# Patient Record
Sex: Male | Born: 1990 | Hispanic: Yes | Marital: Single | Smoking: Former smoker
Health system: Southern US, Community
[De-identification: ages and names within clinical notes are randomized; demographics above are authoritative.]

## PROBLEM LIST (undated history)

## (undated) DIAGNOSIS — M7918 Myalgia, other site: Secondary | ICD-10-CM

## (undated) DIAGNOSIS — K219 Gastro-esophageal reflux disease without esophagitis: Secondary | ICD-10-CM

## (undated) DIAGNOSIS — A09 Infectious gastroenteritis and colitis, unspecified: Secondary | ICD-10-CM

## (undated) DIAGNOSIS — E669 Obesity, unspecified: Secondary | ICD-10-CM

## (undated) DIAGNOSIS — S73191A Other sprain of right hip, initial encounter: Secondary | ICD-10-CM

## (undated) DIAGNOSIS — E66811 Obesity, class 1: Secondary | ICD-10-CM

## (undated) DIAGNOSIS — Z8619 Personal history of other infectious and parasitic diseases: Secondary | ICD-10-CM

## (undated) DIAGNOSIS — M546 Pain in thoracic spine: Secondary | ICD-10-CM

## (undated) DIAGNOSIS — J309 Allergic rhinitis, unspecified: Secondary | ICD-10-CM

## (undated) DIAGNOSIS — M25859 Other specified joint disorders, unspecified hip: Secondary | ICD-10-CM

## (undated) DIAGNOSIS — G8929 Other chronic pain: Secondary | ICD-10-CM

## (undated) HISTORY — PX: FEMUR SURGERY: SHX943

## (undated) HISTORY — DX: Pain in thoracic spine: M54.6

## (undated) HISTORY — DX: Personal history of other infectious and parasitic diseases: Z86.19

## (undated) HISTORY — DX: Gastro-esophageal reflux disease without esophagitis: K21.9

## (undated) HISTORY — DX: Allergic rhinitis, unspecified: J30.9

## (undated) HISTORY — DX: Infectious gastroenteritis and colitis, unspecified: A09

## (undated) HISTORY — DX: Myalgia, other site: M79.18

## (undated) HISTORY — DX: Other chronic pain: G89.29

## (undated) HISTORY — DX: Other specified joint disorders, unspecified hip: M25.859

## (undated) HISTORY — DX: Obesity, class 1: E66.811

## (undated) HISTORY — DX: Other sprain of right hip, initial encounter: S73.191A

## (undated) HISTORY — DX: Obesity, unspecified: E66.9

---
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
## 2016-06-07 DIAGNOSIS — A09 Infectious gastroenteritis and colitis, unspecified: Secondary | ICD-10-CM

## 2016-06-07 DIAGNOSIS — M25859 Other specified joint disorders, unspecified hip: Secondary | ICD-10-CM

## 2016-06-07 DIAGNOSIS — S73191A Other sprain of right hip, initial encounter: Secondary | ICD-10-CM

## 2016-06-07 HISTORY — DX: Other specified joint disorders, unspecified hip: M25.859

## 2016-06-07 HISTORY — DX: Infectious gastroenteritis and colitis, unspecified: A09

## 2016-06-07 HISTORY — DX: Other sprain of right hip, initial encounter: S73.191A

## 2017-04-04 ENCOUNTER — Encounter: Payer: Self-pay | Admitting: Family Medicine

## 2017-04-04 HISTORY — PX: COLONOSCOPY: SHX174

## 2017-04-04 HISTORY — PX: UPPER GASTROINTESTINAL ENDOSCOPY: SHX188

## 2017-04-04 LAB — HM COLONOSCOPY

## 2017-04-12 DIAGNOSIS — N50819 Testicular pain, unspecified: Secondary | ICD-10-CM | POA: Insufficient documentation

## 2017-05-26 ENCOUNTER — Ambulatory Visit
Admission: RE | Admit: 2017-05-26 | Discharge: 2017-05-26 | Disposition: A | Discharge status: Home / Self Care | Payer: PRIVATE HEALTH INSURANCE | Source: Ambulatory Visit | Attending: Pain Medicine | Admitting: Pain Medicine

## 2017-05-26 ENCOUNTER — Ambulatory Visit: Payer: PRIVATE HEALTH INSURANCE | Attending: Pain Medicine | Admitting: Pain Medicine

## 2017-05-26 ENCOUNTER — Encounter: Payer: Self-pay | Admitting: Pain Medicine

## 2017-05-26 ENCOUNTER — Other Ambulatory Visit: Payer: Self-pay

## 2017-05-26 VITALS — BP 127/85 | HR 76 | Temp 98.4°F | Resp 16 | Ht 70.0 in | Wt 189.0 lb

## 2017-05-26 DIAGNOSIS — K219 Gastro-esophageal reflux disease without esophagitis: Secondary | ICD-10-CM | POA: Insufficient documentation

## 2017-05-26 DIAGNOSIS — M545 Low back pain: Secondary | ICD-10-CM | POA: Insufficient documentation

## 2017-05-26 DIAGNOSIS — M549 Dorsalgia, unspecified: Secondary | ICD-10-CM | POA: Diagnosis not present

## 2017-05-26 DIAGNOSIS — N433 Hydrocele, unspecified: Secondary | ICD-10-CM | POA: Insufficient documentation

## 2017-05-26 DIAGNOSIS — R1013 Epigastric pain: Secondary | ICD-10-CM

## 2017-05-26 DIAGNOSIS — M546 Pain in thoracic spine: Secondary | ICD-10-CM | POA: Insufficient documentation

## 2017-05-26 DIAGNOSIS — M25551 Pain in right hip: Secondary | ICD-10-CM | POA: Insufficient documentation

## 2017-05-26 DIAGNOSIS — G8929 Other chronic pain: Secondary | ICD-10-CM | POA: Insufficient documentation

## 2017-05-26 DIAGNOSIS — Z87891 Personal history of nicotine dependence: Secondary | ICD-10-CM | POA: Insufficient documentation

## 2017-05-26 DIAGNOSIS — Z789 Other specified health status: Secondary | ICD-10-CM

## 2017-05-26 DIAGNOSIS — M899 Disorder of bone, unspecified: Secondary | ICD-10-CM

## 2017-05-26 DIAGNOSIS — Z79899 Other long term (current) drug therapy: Secondary | ICD-10-CM | POA: Insufficient documentation

## 2017-05-26 DIAGNOSIS — M7918 Myalgia, other site: Secondary | ICD-10-CM | POA: Insufficient documentation

## 2017-05-26 DIAGNOSIS — M5441 Lumbago with sciatica, right side: Secondary | ICD-10-CM

## 2017-05-26 MED ORDER — PREDNISONE 20 MG PO TABS: ORAL_TABLET | ORAL | 0 refills | Status: DC

## 2017-05-26 MED ORDER — PREDNISONE 20 MG PO TABS: ORAL_TABLET | ORAL | 0 refills | Status: AC

## 2017-05-26 MED ORDER — TRIAMCINOLONE ACETONIDE 40 MG/ML IJ SUSP
40.0000 mg | Freq: Once | INTRAMUSCULAR | Status: AC
  Administered 2017-05-26: 40 mg
  Filled 2017-05-26: qty 1

## 2017-05-26 MED ORDER — CYCLOBENZAPRINE HCL 5 MG PO TABS: 5.0000 mg | ORAL_TABLET | Freq: Three times a day (TID) | ORAL | 0 refills | Status: DC | PRN

## 2017-05-26 MED ORDER — ROPIVACAINE HCL 2 MG/ML IJ SOLN
4.0000 mL | Freq: Once | INTRAMUSCULAR | Status: AC
  Administered 2017-05-26: 10 mL
  Filled 2017-05-26: qty 10

## 2017-05-26 MED ORDER — LIDOCAINE HCL 2 % IJ SOLN
20.0000 mL | Freq: Once | INTRAMUSCULAR | Status: AC
  Administered 2017-05-26: 800 mg

## 2017-05-26 NOTE — Progress Notes (Signed)
Patient's Name: Nicholas Sullivan  MRN: [id number]  Referring Provider: No ref. provider found  DOB: February 08, 1991  PCP: Patient, No Pcp Per  DOS: 05/26/2017  Note by: Nicholas Done, MD  Service setting: Ambulatory outpatient  Specialty: Interventional Pain Management  Location: [facility] (AMB) Pain Management Facility    Patient type: New patient ("FAST-TRACK" Evaluation) &  Interventional Procedure   Warning: This referral option does not include the extensive pharmacological evaluation required for [place] to take over the patient's medication management. The "Fast-Track" system is designed to bypass the new patient referral waiting list, as well as the normal patient evaluation process, in order to provide a patient in distress with a timely pain management intervention. Because the system was not designed to unfairly get a patient into our pain practice ahead of those already waiting, certain restrictions apply. By requesting a "Fast-Track" consult, the referring physician has opted to continue managing the patient's medications in order to get interventional urgent care.  Primary Reason for Visit: Interventional Pain Management Treatment. CC: Back Pain  Procedure:  Anesthesia, Analgesia, Anxiolysis:  Type: Trigger Point Injection (1-2 muscle groups) CPT: 20552 Region:Posterior Thoracolumbar Level: Thoracolumbar Laterality: Right-Sided Paraspinal  Type: Local Anesthesia Local Anesthetic: Lidocaine 1% Route: Infiltration ([place]/IM) IV Access: Declined Sedation: Declined  Indication(s): Analgesia           Indications: 1. Trigger point with back pain   2. Myofascial pain syndrome   3. Upper back pain on right side   4. Chronic thoracic spine pain   5. Chronic low back pain   6. Chronic hip pain, right   7. Disorder of skeletal system   8. Pharmacologic therapy   9. Problems influencing health status    HPI  [name] is a 26 y.o. year old, male patient, who comes today  for a  "Fast-Track" new patient evaluation, as requested by No ref. provider found. The patient has been made aware that this type of referral option is reserved for the Interventional Pain Management portion of our practice and completely excludes the option of medication management. His primarily concern today is the Back Pain  Pain Assessment: Location: Mid, Lower Back Radiating: The pain goes to the front of the ribs. Onset: More than a month ago Duration: Chronic pain Quality: Aching, Radiating, Pressure, Burning, Tender Severity: 3 /10 (self-reported pain score)  Note: Reported level is compatible with observation. A 3/10 is viewed as "Moderate" and described as significantly interfering with activities of daily living (ADL). It becomes difficult to feed, bathe, get dressed, get on and off the toilet or to perform personal hygiene functions. Difficult to get in and out of bed or a chair without assistance. Very distracting. With effort, it can be ignored when deeply involved in activities. When using our objective Pain Scale, levels between 6 and 10/10 are said to belong in an emergency room, as it progressively worsens from a 6/10, described as severely limiting, requiring emergency care not usually available at an outpatient pain management facility. At a 6/10 level, communication becomes difficult and requires great effort. Assistance to reach the emergency department may be required. Facial flushing and profuse sweating along with potentially dangerous increases in heart rate and blood pressure will be evident. Timing: Constant Modifying factors: rest and hot packs.  Onset and Duration: Sudden and Date of onset: 05/09/2017 Cause of pain: Unknown Severity: Getting better, NAS-11 at its worse: 8/10, NAS-11 at its best: 4/10, NAS-11 now: 4/10 and NAS-11 on the  average: 6/10 Timing: Not influenced by the time of the day, During activity or exercise and After activity or exercise Aggravating  Factors: Bending, Lifiting, Motion, Prolonged standing, Twisting and Walking Alleviating Factors: Hot packs, Lying down, Medications, Resting and Sleeping Associated Problems: Day-time cramps, Night-time cramps, Numbness, Tingling and Pain that does not allow patient to sleep Quality of Pain: Aching, Agonizing, Intermittent, Pulsating, Sharp, Stabbing and Throbbing Previous Examinations or Tests: CT scan, MRI scan and Orthopedic evaluation Previous Treatments: Trigger point injections  The patient comes into the clinics today, referred to [place] for a chronic pain management and evaluation. According to the patient for the past 4 months he has been having some problems with abdominal pain, low back pain, and more recently some hip pain and upper back pain. During today's evaluation, he indicates that everything has been improving with the exception of the upper back pain. Physical examination of the area reveals an active trigger point to the right of the paravertebral muscles at the T7-8 level.   Meds   Current Outpatient Medications:  .  acetaminophen (TYLENOL) 325 MG tablet, Take by mouth daily., Disp: , Rfl:  .  pantoprazole (PROTONIX) 40 MG tablet, Take 80 mg by mouth., Disp: , Rfl:  .  predniSONE (DELTASONE) 20 MG tablet, Take 3 tab(s) in the morning x 3 days, then 2 tab(s) x 3 days, followed by 1 tab x 3 days., Disp: 21 tablet, Rfl: 0 .  cyclobenzaprine (FLEXERIL) 5 MG tablet, Take 1 tablet (5 mg total) by mouth 3 (three) times daily as needed for muscle spasms., Disp: 90 tablet, Rfl: 0  Imaging Review  Large Joint Arthrocentesis: R greater trochanteric bursa Location: hip - R greater trochanteric bursa Medications administered: 0.5 mL Triamcinolone 40 MG/ML; 2 mL lidocaine 2 % Nicholas Sullivan 05/09/2017 10:17 AM  MRI OF THE RIGHT HIP WITH CONTRAST. IMPRESSION: 1. 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 2.  Contour irregularity of the anterior superior labrum compatible with fraying/tearing. 3. Mild peritendinous/bursal fluid at the trochanteric gluteus medius and minimus tendon attachments, right greater than left. 4. Tendinosis with trace peritendinous fluid at the ischial hamstring tendon attachments with possible few small foci of low-grade interstitial tearing. Dictated and electronically signed by Sander Radon, MD on 04/21/2017 11:08 AM  Complexity Note: Imaging results reviewed.                        ROS  Cardiovascular History: Chest pain Pulmonary or Respiratory History: Shortness of breath and Smoking Neurological History: No reported neurological signs or symptoms such as seizures, abnormal skin sensations, urinary and/or fecal incontinence, being born with an abnormal open spine and/or a tethered spinal cord Review of Past Neurological Studies: No results found for this or any previous visit. Psychological-Psychiatric History: Prone to panicking Gastrointestinal History: Reflux or heatburn and Irregular, infrequent bowel movements (Constipation) Genitourinary History: No reported renal or genitourinary signs or symptoms such as difficulty voiding or producing urine, peeing blood, non-functioning kidney, kidney stones, difficulty emptying the bladder, difficulty controlling the flow of urine, or chronic kidney disease Hematological History: No reported hematological signs or symptoms such as prolonged bleeding, low or poor functioning platelets, bruising or bleeding easily, hereditary bleeding problems, low energy levels due to low hemoglobin or being anemic Endocrine History: No reported endocrine signs or symptoms such as high or low blood sugar, rapid heart rate due to high thyroid levels,  obesity or weight gain due to slow thyroid or thyroid disease Rheumatologic History: No reported rheumatological signs and symptoms such as fatigue, joint pain, tenderness, swelling, redness,  heat, stiffness, decreased range of motion, with or without associated rash Musculoskeletal History: Negative for myasthenia gravis, muscular dystrophy, multiple sclerosis or malignant hyperthermia Work History: Working full time  Allergies  [name] is allergic to broccoli [brassica oleracea italica] and grass extracts [gramineae pollens].  Laboratory Chemistry   Note: [facility]  [facility]  Drug: Mr. Nicholas Nicholas Sullivan  reports that he does not use drugs. Alcohol:  reports that he does not drink alcohol. Tobacco:  reports that he quit smoking about 2 years ago. he has never used smokeless tobacco. Medical:  has a past medical history of GERD (gastroesophageal reflux disease). Family: family history includes Alcohol abuse in his father.  History reviewed. No pertinent surgical history. Active Ambulatory Problems    Diagnosis Date Noted  . Disorder of skeletal system 05/26/2017  . Pharmacologic therapy 05/26/2017  . Problems influencing health status 05/26/2017  . Chronic hip pain (Right) 05/26/2017  . Chronic low back pain 05/26/2017  . Chronic thoracic spine pain 05/26/2017  . Chronic upper back pain (Right) 05/26/2017  . Myofascial pain syndrome 05/26/2017  . Trigger point with back pain 05/26/2017  . Chronic musculoskeletal pain 05/26/2017  . Testicular pain 04/12/2017  . 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 Chronic epigastric abdominal pain 05/26/2017   Resolved Ambulatory Problems    Diagnosis Date Noted  . No Resolved Ambulatory Problems   Past Medical History:  Diagnosis Date  . GERD (gastroesophageal reflux disease)    Constitutional Exam  General appearance: Well nourished, well developed, and well hydrated. In no apparent acute distress Vitals:   05/26/17 1426 05/26/17 1554 05/26/17 1558  BP: 137/74 (!) 152/94 127/85  Pulse: 77 70 76  Resp: 16 16 16   Temp: 98.4 F (36.9 C)    TempSrc: Oral    SpO2: (!)  66% 96% 96%  Weight: 189 lb (85.7 kg)    Height: 5\' 10"  (1.778 m)     BMI Assessment: Estimated body mass index is 27.12 kg/m as calculated from the following:   Height as of this encounter: 5\' 10"  (1.778 m).   Weight as of this encounter: 189 lb (85.7 kg).  BMI interpretation table: BMI level Category Range association with higher incidence of chronic pain  <18 kg/m2 Underweight   18.5-24.9 kg/m2 Ideal body weight   25-29.9 kg/m2 Overweight Increased incidence by 20%  30-34.9 kg/m2 Obese (Class I) Increased incidence by 68%  35-39.9 kg/m2 Severe obesity (Class II) Increased incidence by 136%  >40 kg/m2 Extreme obesity (Class III) Increased incidence by 254%   BMI Readings from Last 4 Encounters:  05/26/17 27.12 kg/m   Wt Readings from Last 4 Encounters:  05/26/17 189 lb (85.7 kg)  Psych/Mental status: Alert, oriented x 3 (person, place, & time)       Eyes: PERLA Respiratory: No evidence of acute respiratory distress  Thoracic Spine Area Exam  Skin & Axial Inspection: No masses, redness, or swelling Alignment: Symmetrical Functional ROM: Unrestricted ROM Stability: No instability detected Muscle Tone/Strength: Functionally intact. No obvious neuro-muscular anomalies detected. Sensory (Neurological): Movement associated discomfort Muscle strength & Tone: Complains of area being tender to palpation  Lumbar Spine Area Exam  Skin & Axial Inspection: No masses, redness, or swelling Alignment: Symmetrical Functional ROM: Unrestricted ROM  Stability: No instability detected Muscle Tone/Strength: Functionally intact. No obvious neuro-muscular anomalies detected. Sensory (Neurological): Unimpaired Palpation: No palpable anomalies       Provocative Tests: Lumbar Hyperextension and rotation test: evaluation deferred today       Lumbar Lateral bending test: evaluation deferred today       Patrick's Maneuver: evaluation deferred today                    Gait & Posture  Assessment  Ambulation: Unassisted Gait: Relatively normal for age and body habitus Posture: WNL   Lower Extremity Exam    Side: Right lower extremity  Side: Left lower extremity  Skin & Extremity Inspection: Skin color, temperature, and hair growth are WNL. No peripheral edema or cyanosis. No masses, redness, swelling, asymmetry, or associated skin lesions. No contractures.  Skin & Extremity Inspection: Skin color, temperature, and hair growth are WNL. No peripheral edema or cyanosis. No masses, redness, swelling, asymmetry, or associated skin lesions. No contractures.  Functional ROM: Unrestricted ROM          Functional ROM: Unrestricted ROM          Muscle Tone/Strength: Functionally intact. No obvious neuro-muscular anomalies detected.  Muscle Tone/Strength: Functionally intact. No obvious neuro-muscular anomalies detected.  Sensory (Neurological): Unimpaired  Sensory (Neurological): Unimpaired  Palpation: No palpable anomalies  Palpation: No palpable anomalies   Pre-op Assessment:  [name] is a 26 y.o. (year old), male patient, seen today for interventional treatment. He  has no past surgical history on file. [name] has a current medication list which includes the following prescription(s): acetaminophen, pantoprazole, prednisone, and cyclobenzaprine. His primarily concern today is the Back Pain  Initial Vital Signs: Blood pressure 127/85, pulse 76, temperature 98.4 F (36.9 C), temperature source Oral, resp. rate 16, height 5\' 10"  (1.778 m), weight 189 lb (85.7 kg), SpO2 96 %. BMI: Estimated body mass index is 27.12 kg/m as calculated from the following:   Height as of this encounter: 5\' 10"  (1.778 m).   Weight as of this encounter: 189 lb (85.7 kg).  Risk Assessment: Allergies: Reviewed. He is allergic to broccoli [brassica oleracea italica] and grass extracts [gramineae pollens].  Allergy Precautions: None required Coagulopathies: Reviewed. None identified.   Blood-thinner therapy: None at this time Active Infection(s): Reviewed. None identified. [name] is afebrile  Site Confirmation: [name] was asked to confirm the procedure and laterality before marking the site Procedure checklist: Completed Consent: Before the procedure and under the influence of no sedative(s), amnesic(s), or anxiolytics, the patient was informed of the treatment options, risks and possible complications. To fulfill our ethical and legal obligations, as recommended by the American Medical Association's Code of Ethics, I have informed the patient of my clinical impression; the nature and purpose of the treatment or procedure; the risks, benefits, and possible complications of the intervention; the alternatives, including doing nothing; the risk(s) and benefit(s) of the alternative treatment(s) or procedure(s); and the risk(s) and benefit(s) of doing nothing. The patient was provided information about the general risks and possible complications associated with the procedure. These may include, but are not limited to: failure to achieve desired goals, infection, bleeding, organ or nerve damage, allergic reactions, paralysis, and death. In addition, the patient was informed of those risks and complications associated to the procedure, such as failure to decrease pain; infection; bleeding; organ or nerve damage with subsequent damage to sensory, motor, and/or autonomic systems, resulting in permanent pain, numbness,  and/or weakness of one or several areas of the body; allergic reactions; (i.e.: anaphylactic reaction); and/or death. Furthermore, the patient was informed of those risks and complications associated with the medications. These include, but are not limited to: allergic reactions (i.e.: anaphylactic or anaphylactoid reaction(s)); adrenal axis suppression; blood sugar elevation that in diabetics may result in ketoacidosis or comma; water retention that in patients  with history of congestive heart failure may result in shortness of breath, pulmonary edema, and decompensation with resultant heart failure; weight gain; swelling or edema; medication-induced neural toxicity; particulate matter embolism and blood vessel occlusion with resultant organ, and/or nervous system infarction; and/or aseptic necrosis of one or more joints. Finally, the patient was informed that Medicine is not an exact science; therefore, there is also the possibility of unforeseen or unpredictable risks and/or possible complications that may result in a catastrophic outcome. The patient indicated having understood very clearly. We have given the patient no guarantees and we have made no promises. Enough time was given to the patient to ask questions, all of which were answered to the patient's satisfaction. [name] has indicated that he wanted to continue with the procedure. Attestation: I, the ordering provider, attest that I have discussed with the patient the benefits, risks, side-effects, alternatives, likelihood of achieving goals, and potential problems during recovery for the procedure that I have provided informed consent. Date: 05/26/2017; Time: 6:42 PM  Pre-Procedure Preparation:  Monitoring: As per clinic protocol. Respiration, ETCO2, SpO2, BP, heart rate and rhythm monitor placed and checked for adequate function Safety Precautions: Patient was assessed for positional comfort and pressure points before starting the procedure. Time-out: I initiated and conducted the "Time-out" before starting the procedure, as per protocol. The patient was asked to participate by confirming the accuracy of the "Time Out" information. Verification of the correct person, site, and procedure were performed and confirmed by me, the nursing staff, and the patient. "Time-out" conducted as per Joint Commission's Universal Protocol (UP.01.01.01). "Time-out" Date & Time: 05/26/2017; 1555  hrs.  Description of Procedure Process:   Position: Prone Target Area: Trigger Point over the right erector spinae and a muscle, specifically the longissimus thoracis. Approach: Ipsilateral approach. Area Prepped: Entire Posterior Thoracolumbar Region Prepping solution: ChloraPrep (2% chlorhexidine gluconate and 70% isopropyl alcohol) Safety Precautions: Aspiration looking for blood return was conducted prior to all injections. At no point did we inject any substances, as a needle was being advanced. No attempts were made at seeking any paresthesias. Safe injection practices and needle disposal techniques used. Medications properly checked for expiration dates. SDV (single dose vial) medications used. Description of the Procedure: Protocol guidelines were followed. The patient was placed in position over the fluoroscopy table. The target area was identified and the area prepped in the usual manner. Skin desensitized using vapocoolant spray. Skin & deeper tissues infiltrated with local anesthetic. Appropriate amount of time allowed to pass for local anesthetics to take effect. The procedure needles were then advanced to the target area. Proper needle placement secured. Negative aspiration confirmed. Solution injected in intermittent fashion, asking for systemic symptoms every 0.5cc of injectate. The needles were then removed and the area cleansed, making sure to leave some of the prepping solution back to take advantage of its long term bactericidal properties. Vitals:   05/26/17 1426 05/26/17 1554 05/26/17 1558  BP: 137/74 (!) 152/94 127/85  Pulse: 77 70 76  Resp: 16 16 16   Temp: 98.4 F (36.9 C)    TempSrc: Oral  SpO2: (!) 66% 96% 96%  Weight: 189 lb (85.7 kg)    Height: 5\' 10"  (1.778 m)      Start Time: 1555 hrs. Materials:  Needle(s) Type: regular needle Gauge: 25G Length: 1.5-in Medication(s): We administered lidocaine, triamcinolone acetonide 40 mg/ml (1mL), and ropivacaine (PF) 2  mg/mL (0.2%) (4 mL). Please see chart orders for dosing details.  Imaging Guidance:  Type of Imaging Technique: None used Indication(s): N/A Exposure Time: No patient exposure Contrast: None used. Fluoroscopic Guidance: N/A Ultrasound Guidance: N/A Interpretation: N/A  Antibiotic Prophylaxis:  Indication(s): None identified Antibiotic given: None  Post-operative Assessment:  EBL: None Complications: No immediate post-treatment complications observed by team, or reported by patient. Note: The patient tolerated the entire procedure well. A repeat set of vitals were taken after the procedure and the patient was kept under observation following institutional policy, for this type of procedure. Post-procedural neurological assessment was performed, showing return to baseline, prior to discharge. The patient was provided with post-procedure discharge instructions, including a section on how to identify potential problems. Should any problems arise concerning this procedure, the patient was given instructions to immediately contact us, at any time, without hesitation. In any case, we plan to contact the patient by telephone for a follow-up status report regarding this interventional procedure. Comments:  No additional relevant information.  Plan of Care   Imaging Orders     DG Lumbar Spine Complete W/Bend     DG Thoracic Spine 2 View  Procedure Orders     TRIGGER POINT INJECTION  Medications ordered for procedure: Meds ordered this encounter  Medications  . lidocaine (XYLOCAINE) 2 % (with pres) injection 400 mg  . triamcinolone acetonide (KENALOG-40) injection 40 mg  . ropivacaine (PF) 2 mg/mL (0.2%) (NAROPIN) injection 4 mL  . predniSONE (DELTASONE) 20 MG tablet    Sig: Take 3 tab(s) in the morning x 3 days, then 2 tab(s) x 3 days, followed by 1 tab x 3 days.    Dispense:  21 tablet    Refill:  0    Do not add to the "Automatic Refill" notification system.  . cyclobenzaprine  (FLEXERIL) 5 MG tablet    Sig: Take 1 tablet (5 mg total) by mouth 3 (three) times daily as needed for muscle spasms.    Dispense:  90 tablet    Refill:  0    Do not place this medication, or any other prescription from our practice, on "Automatic Refill". Patient may have prescription filled one day early if pharmacy is closed on scheduled refill date.   Medications administered: We administered lidocaine, triamcinolone acetonide, and ropivacaine (PF) 2 mg/mL (0.2%).  See the medical record for exact dosing, route, and time of administration.  This SmartLink is deprecated. Use AVSMEDLIST instead to display the medication list for a patient. Disposition: Discharge home  Discharge Date & Time: 05/26/2017; 1558 hrs.   Physician-requested Follow-up: Return if symptoms worsen or fail to improve. No future appointments. Primary Care Physician: Patient, No Pcp Per Location: [facility] Note by: Nicholas Done, MD Date: 05/26/2017; Time: 6:45 PM  Disclaimer:  Medicine is not an Visual merchandiser. The only guarantee in medicine is that nothing is guaranteed. It is important to note that the decision to proceed with this intervention was based on the information collected from the patient. The Data and conclusions were drawn from the patient's questionnaire, the interview, and the physical examination. Because the information was provided in large part by the  patient, it cannot be guaranteed that it has not been purposely or unconsciously manipulated. Every effort has been made to obtain as much relevant data as possible for this evaluation. It is important to note that the conclusions that lead to this procedure are derived in large part from the available data. Always take into account that the treatment will also be dependent on availability of resources and existing treatment guidelines, considered by other Pain Management Practitioners as being common knowledge and  practice, at the time of the intervention. For Medico-Legal purposes, it is also important to point out that variation in procedural techniques and pharmacological choices are the acceptable norm. The indications, contraindications, technique, and results of the above procedure should only be interpreted and judged by a Board-Certified Interventional Pain Specialist with extensive familiarity and expertise in the same exact procedure and technique.

## 2017-05-26 NOTE — Patient Instructions (Signed)

## 2017-05-26 NOTE — Progress Notes (Signed)
Safety precautions to be maintained throughout the outpatient stay will include: orient to surroundings, keep bed in low position, maintain call bell within reach at all times, provide assistance with transfer out of bed and ambulation.  

## 2017-05-27 ENCOUNTER — Telehealth: Payer: Self-pay

## 2017-05-27 LAB — VITAMIN B12: VITAMIN B 12: 513 pg/mL (ref 232–1245)

## 2017-05-27 LAB — MAGNESIUM: MAGNESIUM: 2.2 mg/dL (ref 1.6–2.3)

## 2017-05-27 NOTE — Telephone Encounter (Signed)
Post procedure phone call.  LM 

## 2017-05-27 NOTE — Telephone Encounter (Signed)
Notified patient that his script for Flexeril was left here at the office. LEft message.

## 2017-06-24 ENCOUNTER — Other Ambulatory Visit: Payer: Self-pay

## 2018-04-03 ENCOUNTER — Encounter: Payer: Self-pay | Admitting: Family Medicine

## 2018-04-03 LAB — CBC AND DIFFERENTIAL
HEMATOCRIT: 48 (ref 41–53)
Hemoglobin: 17.3 (ref 13.5–17.5)
Neutrophils Absolute: 4
PLATELETS: 276 (ref 150–399)
WBC: 7.5

## 2018-04-03 LAB — BASIC METABOLIC PANEL
BUN: 10 (ref 4–21)
Creatinine: 1.1 (ref 0.6–1.3)
GLUCOSE: 90
POTASSIUM: 4.4 (ref 3.4–5.3)
Sodium: 141 (ref 137–147)

## 2018-04-03 LAB — HEPATIC FUNCTION PANEL
ALK PHOS: 47 (ref 25–125)
ALT: 77 — AB (ref 10–40)
AST: 22 (ref 14–40)
BILIRUBIN, TOTAL: 0.8

## 2018-04-03 LAB — LIPID PANEL
CHOLESTEROL: 218 — AB (ref 0–200)
HDL: 32 — AB (ref 35–70)
LDL Cholesterol: 110
LDl/HDL Ratio: 3.5
TRIGLYCERIDES: 378 — AB (ref 40–160)

## 2018-04-24 ENCOUNTER — Encounter: Payer: Self-pay | Admitting: Family Medicine

## 2018-04-24 ENCOUNTER — Ambulatory Visit (INDEPENDENT_AMBULATORY_CARE_PROVIDER_SITE_OTHER): Payer: Self-pay | Admitting: Family Medicine

## 2018-04-24 VITALS — BP 114/73 | HR 53 | Temp 98.1°F | Resp 16 | Ht 67.0 in | Wt 220.5 lb

## 2018-04-24 DIAGNOSIS — E781 Pure hyperglyceridemia: Secondary | ICD-10-CM

## 2018-04-24 DIAGNOSIS — Z23 Encounter for immunization: Secondary | ICD-10-CM

## 2018-04-24 DIAGNOSIS — E669 Obesity, unspecified: Secondary | ICD-10-CM

## 2018-04-24 NOTE — Progress Notes (Addendum)
Office Note 04/24/2018  CC:  Chief Complaint  Patient presents with  . Establish Care    No previous PCP  . Abnormal Lab    done 2 weeks ago    HPI:  Nicholas Sullivan is a 27 y.o. male who is here to establish care Patient's most recent primary MD: has not had one in 10-12 yrs. Old records were reviewed prior to or during today's visit.  Brings in labs from about 3 weeks ago done in [place] reviewed with pt today. The MD at that office did not review them with pt b/c pt left for the [place] the next day. CBC with diff and CMET all normal. UA was normal. Lipids normal except trigs 378.  Discussed diet/exercise and wt loss as tx for this. He has been working on improving his diet the last 3 wks.   Exercise: not right now.  Just got membership to the [employer]. Goal wt of 175-180.  Past Medical History:  Diagnosis Date  . Allergic rhinitis   . Chronic thoracic back pain   . Femoral acetabular impingement 2018  . GERD (gastroesophageal reflux disease)   . History of chicken pox   . Infectious colitis 2018   e coli  . Myofascial pain syndrome   . Obesity, Class I, BMI 30-34.9   . Tear of right acetabular labrum 2018    Past Surgical History:  Procedure Laterality Date  . COLONOSCOPY  04/04/2017   abd pains with diarrhea-->bx's benign.  NO POLYPS.  +Internal hemorrhoids.  . FEMUR SURGERY Right    to relieve acetab impingement syndrome.  Marland Kitchen UPPER GASTROINTESTINAL ENDOSCOPY  04/04/2017   GERD changes with NO Barrett's esoph changes and no eosinophils noted.  Mild chronic inactive gastritis: H pylori NEG.  Duodenal bx benign.    Family History  Problem Relation Age of Onset  . Alcohol abuse Father   . Arthritis Mother   . Learning disabilities Brother     Social History   Socioeconomic History  . Marital status: Single    Spouse name: Not on file  . Number of children: Not on file  . Years of education: Not on file  . Highest education level: Not on  file  Occupational History  . Not on file  Social Needs  . Financial resource strain: Not hard at all  . Food insecurity:    Worry: Patient refused    Inability: Patient refused  . Transportation needs:    Medical: Patient refused    Non-medical: Patient refused  Tobacco Use  . Smoking status: Former Smoker    Packs/day: 0.25    Years: 3.00    Pack years: 0.75    Types: Cigarettes    Last attempt to quit: 2016    Years since quitting: 3.8  . Smokeless tobacco: Never Used  Substance and Sexual Activity  . Alcohol use: Yes    Frequency: Never    Comment: 1-2 drinks a week  . Drug use: No  . Sexual activity: Not on file  Lifestyle  . Physical activity:    Days per week: Patient refused    Minutes per session: Patient refused  . Stress: Not at all  Relationships  . Social connections:    Talks on phone: Patient refused    Gets together: Patient refused    Attends religious service: Patient refused    Active member of club or organization: Patient refused    Attends meetings of clubs or organizations: Patient  refused    Relationship status: Patient refused  . Intimate partner violence:    Fear of current or ex partner: Patient refused    Emotionally abused: Patient refused    Physically abused: Patient refused    Forced sexual activity: Patient refused  Other Topics Concern  . Not on file  Social History Narrative   Single, no children.   [place]ocated from [place] approx 2012   Educ: [employer]     Occup: unemployed as of 04/2018.  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  [place]   Tob: none   Alc: 2 beers a couple days a week.    Outpatient Encounter Medications as of 04/24/2018  Medication Sig  . [DISCONTINUED] acetaminophen (TYLENOL) 325 MG tablet Take by mouth daily.  . [DISCONTINUED] cyclobenzaprine (FLEXERIL) 5 MG tablet Take 1 tablet (5 mg total) by mouth 3 (three) times daily as needed for muscle spasms.  . [DISCONTINUED] pantoprazole (PROTONIX)  40 MG tablet Take 80 mg by mouth.   No facility-administered encounter medications on file as of 04/24/2018.     Allergies  Allergen Reactions  . Brassica Oleracea Italica   . Grass Extracts [Gramineae Pollens]     ROS Review of Systems  Constitutional: Negative for appetite change, chills, fatigue and fever.  HENT: Negative for congestion, dental problem, ear pain and sore throat.   Eyes: Negative for discharge, redness and visual disturbance.  Respiratory: Negative for cough, chest tightness, shortness of breath and wheezing.   Cardiovascular: Negative for chest pain, palpitations and leg swelling.  Gastrointestinal: Negative for abdominal pain, blood in stool, diarrhea, nausea and vomiting.  Genitourinary: Negative for difficulty urinating, dysuria, flank pain, frequency, hematuria and urgency.  Musculoskeletal: Negative for arthralgias, back pain, joint swelling, myalgias and neck stiffness.  Skin: Negative for pallor and rash.  Neurological: Negative for dizziness, speech difficulty, weakness and headaches.  Hematological: Negative for adenopathy. Does not bruise/bleed easily.  Psychiatric/Behavioral: Negative for confusion and sleep disturbance. The patient is not nervous/anxious.     PE; Blood pressure 114/73, pulse (!) 53, temperature 98.1 F (36.7 C), temperature source Oral, resp. rate 16, height 5\' 7"  (1.702 m), weight 220 lb 8 oz (100 kg), SpO2 96 %. Body mass index is 34.54 kg/m.  Gen: Alert, well appearing.  Patient is oriented to person, place, time, and situation. AFFECT: pleasant, lucid thought and speech. ZOX:WRUEENT:Eyes: no injection, icteris, swelling, or exudate.  EOMI, PERRLA. Mouth: lips without lesion/swelling.  Oral mucosa pink and moist. Oropharynx without erythema, exudate, or swelling.  Neck - No masses or thyromegaly or limitation in range of motion CV: RRR, no m/r/g.   LUNGS: CTA bilat, nonlabored resps, good aeration in all lung fields. ABD: soft,  NT/ND EXT: no clubbing or cyanosis.  no edema.   Pertinent labs:  none  ASSESSMENT AND PLAN:   New pt; no old records to obtain.  1) Hypertriglyceridemia and obesity: no med needed. Discussed healthy dietary modification and exercise habits. Goal wt for 175 or so, encouraged goal of 1 lb wt loss per week.  An After Visit Summary was printed and given to the patient.  Return for as needed.  Signed:  Santiago BumpersPhil Daylyn Christine, MD           04/24/2018

## 2018-04-24 NOTE — Addendum Note (Signed)
Addended by: Smitty KnudsenSUTHERLAND, Shantika Bermea K on: 04/24/2018 03:43 PM   Modules accepted: Orders

## 2018-08-04 IMAGING — CR DG LUMBAR SPINE COMPLETE W/ BEND
7 series · 7 of 7 positions shown · non-contrast
Comparison: None.

CLINICAL DATA: Back pain for 2 weeks after lifting injury.

EXAM:
LUMBAR SPINE - COMPLETE WITH BENDING VIEWS

[l-spine ap]
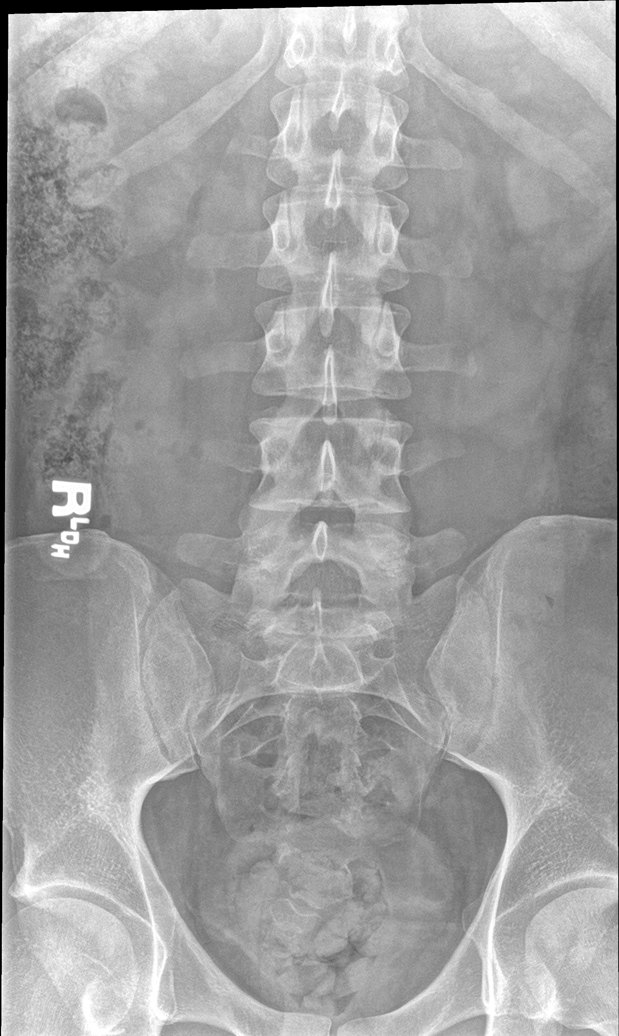

[l-spine obl (1 of 2)]
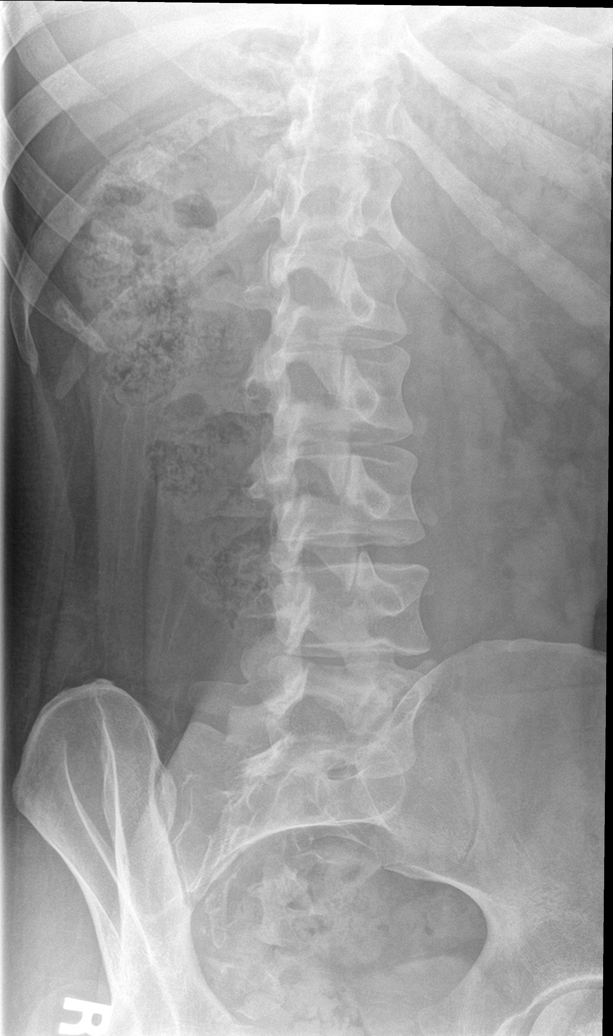

[l-spine obl (2 of 2)]
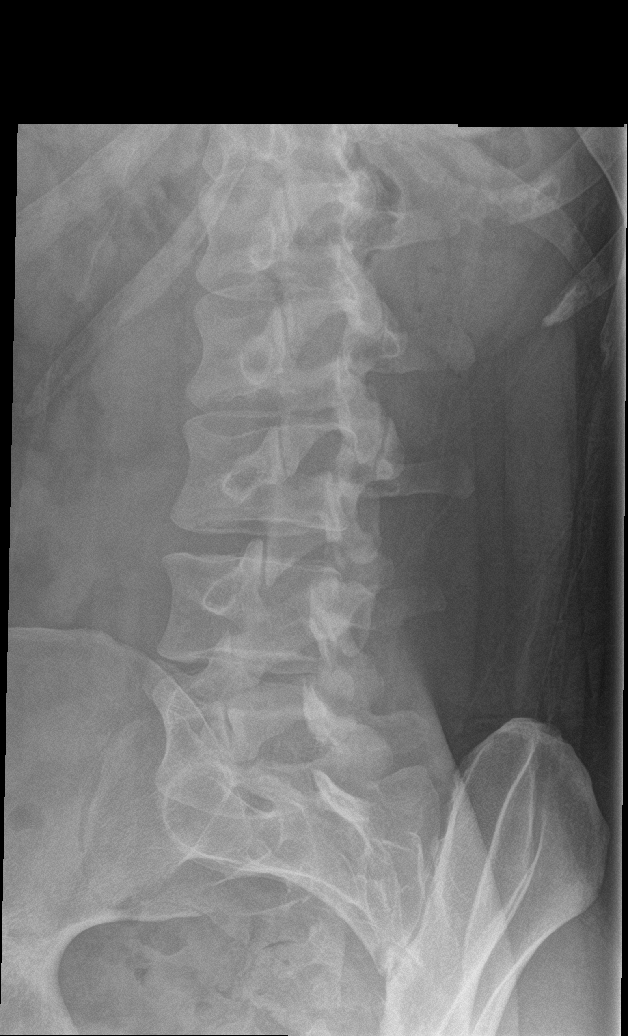

[l-spine lat]
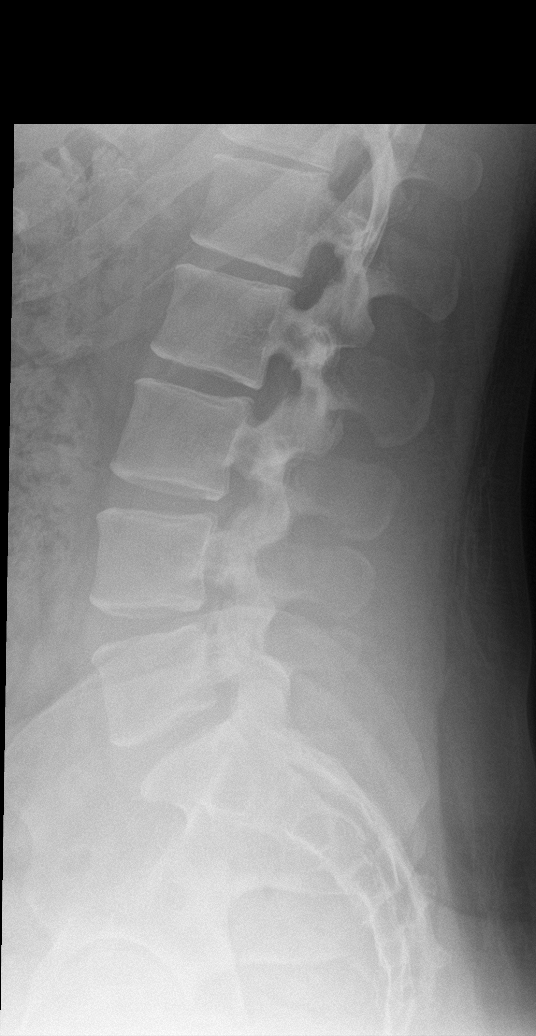

[l-spine flex]
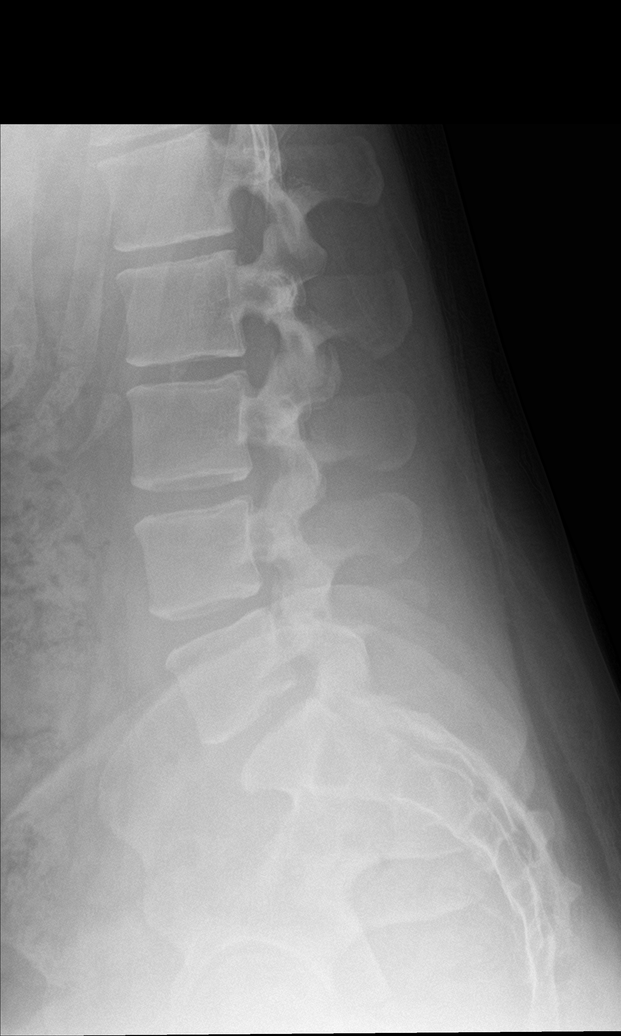

[l-spine ext]
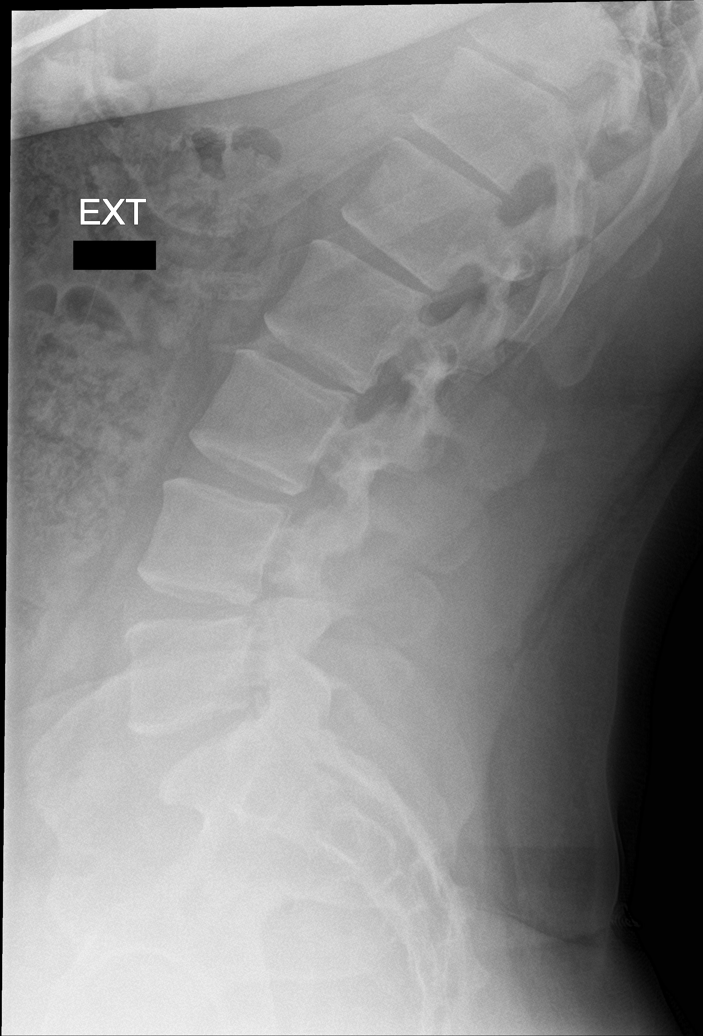

[l-spine spot]
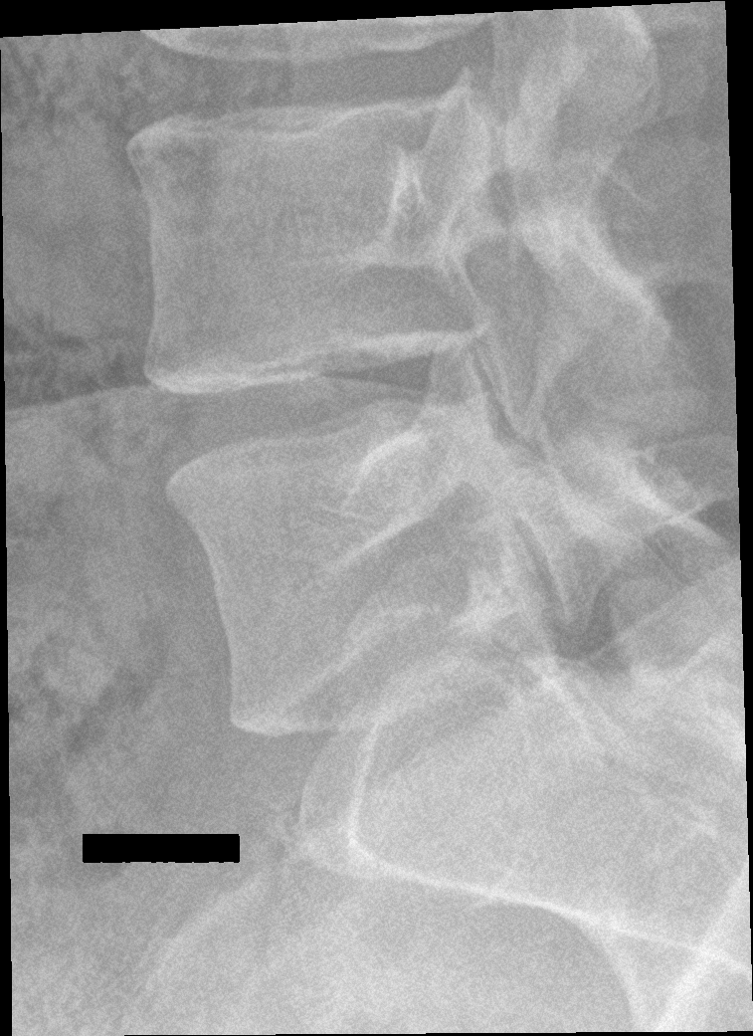

[7 of 7 positions shown; findings below may reference images not displayed]

FINDINGS: No fracture or spondylolisthesis is noted. No change in vertebral
body alignment is noted on flexion or extension views. Disc spaces
are well-maintained. Normal bone density is noted. No definite
abnormality seen involving the posterior facet joints.
IMPRESSION: Normal lumbar spine.

## 2019-05-03 ENCOUNTER — Other Ambulatory Visit: Payer: Self-pay

## 2019-05-03 ENCOUNTER — Encounter (HOSPITAL_BASED_OUTPATIENT_CLINIC_OR_DEPARTMENT_OTHER): Payer: Self-pay | Admitting: Emergency Medicine

## 2019-05-03 ENCOUNTER — Emergency Department (HOSPITAL_BASED_OUTPATIENT_CLINIC_OR_DEPARTMENT_OTHER)
Admission: EM | Admit: 2019-05-03 | Discharge: 2019-05-03 | Disposition: A | Discharge status: Home / Self Care | Payer: BLUE CROSS/BLUE SHIELD | Attending: Emergency Medicine | Admitting: Emergency Medicine

## 2019-05-03 DIAGNOSIS — W260XXA Contact with knife, initial encounter: Secondary | ICD-10-CM | POA: Diagnosis not present

## 2019-05-03 DIAGNOSIS — S61211A Laceration without foreign body of left index finger without damage to nail, initial encounter: Secondary | ICD-10-CM | POA: Insufficient documentation

## 2019-05-03 DIAGNOSIS — Y999 Unspecified external cause status: Secondary | ICD-10-CM | POA: Diagnosis not present

## 2019-05-03 DIAGNOSIS — Y92008 Other place in unspecified non-institutional (private) residence as the place of occurrence of the external cause: Secondary | ICD-10-CM | POA: Insufficient documentation

## 2019-05-03 DIAGNOSIS — Z87891 Personal history of nicotine dependence: Secondary | ICD-10-CM | POA: Insufficient documentation

## 2019-05-03 DIAGNOSIS — Y93G1 Activity, food preparation and clean up: Secondary | ICD-10-CM | POA: Insufficient documentation

## 2019-05-03 MED ORDER — LIDOCAINE HCL (PF) 1 % IJ SOLN
20.0000 mL | Freq: Once | INTRAMUSCULAR | Status: DC
  Filled 2019-05-03: qty 20

## 2019-05-03 NOTE — ED Notes (Signed)
ED Provider at bedside for lac repair 

## 2019-05-03 NOTE — Discharge Instructions (Signed)
Treatment: Keep your wound dry and dressing applied until this time tomorrow. After 24 hours, you may wash with warm soapy water. Dry and apply antibiotic ointment and clean dressing. Do this daily until your sutures are removed. ° °Follow-up: Please follow-up with your primary care provider or return to emergency department in 10 days for suture removal. Be aware of signs of infection: fever, increasing pain, redness, swelling, drainage from the area. Please call your primary care provider or return to emergency department if you develop any of these symptoms or if any of the sutures come out prior to removal. Please return to the emergency department if you develop any other new or worsening symptoms. ° °

## 2019-05-03 NOTE — ED Provider Notes (Addendum)
[facility] EMERGENCY DEPARTMENT Provider Note   CSN: [id number] Arrival date & time: 05/03/19  1113     History   Chief Complaint Chief Complaint  Patient presents with   Laceration    HPI Nicholas Sullivan is a 28 y.o. male who presents with left index finger laceration while cutting [place].  He reports the knife went through his finger.  He thinks the knife was clean.  His tetanus is up-to-date.  He denies any numbness or tingling.  He has full range of motion of the digit.  He wrapped the wound prior to arrival.     HPI  Past Medical History:  Diagnosis Date   Allergic rhinitis    Chronic thoracic back pain    Femoral acetabular impingement 2018   GERD (gastroesophageal reflux disease)    History of chicken pox    Infectious colitis 2018   e coli   Myofascial pain syndrome    Obesity, Class I, BMI 30-34.9    Tear of right acetabular labrum 2018    Patient Active Problem List   Diagnosis Date Noted   Disorder of skeletal system 05/26/2017   Pharmacologic therapy 05/26/2017   Problems influencing health status 05/26/2017   Chronic hip pain (Right) 05/26/2017   Chronic low back pain 05/26/2017   Chronic thoracic spine pain 05/26/2017   Chronic upper back pain (Right) 05/26/2017   Myofascial pain syndrome 05/26/2017   Trigger point with back pain 05/26/2017   Chronic musculoskeletal pain 05/26/2017   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  NO POLYPS.  +Internal hemorrhoids.   FEMUR SURGERY Right    to relieve acetab impingement syndrome.   UPPER GASTROINTESTINAL ENDOSCOPY  04/04/2017   GERD changes with NO Barrett's esoph changes and no eosinophils noted.  Mild chronic inactive gastritis: H pylori NEG.  Duodenal bx benign.         Home Medications    Prior to Admission medications   Not on File    Family History Family History  Problem Relation Age of Onset   Alcohol abuse Father    Arthritis Mother    Learning disabilities Brother     Social History Social History   Tobacco Use   Smoking status: Former Smoker    Packs/day: 0.25    Years: 3.00    Pack years: 0.75    Types: Cigarettes    Quit date: 2016    Years since quitting: 4.9   Smokeless tobacco: Never Used  Substance Use Topics   Alcohol use: Yes    Frequency: Never    Comment: 1-2 drinks a week   Drug use: No     Allergies   Brassica oleracea and Grass extracts [gramineae pollens]   Review of Systems Review of Systems  Skin: Positive for wound.  Neurological: Negative for numbness.     Physical Exam Updated Vital Signs BP (!) 148/99 (BP Location: Left Arm)    Pulse 66    Temp 99 F (37.2 C) (Oral)    Resp 16    Ht 5\' 8"  (1.727 m)    Wt 99.8 kg    SpO2 99%    BMI 33.45 kg/m   Physical Exam Vitals signs and nursing note reviewed.  Constitutional:      General: He is not  in acute distress.    Appearance: He is well-developed. He is not diaphoretic.  HENT:     Head: Normocephalic and atraumatic.     Mouth/Throat:     Pharynx: No oropharyngeal exudate.  Eyes:     General: No scleral icterus.       Right eye: No discharge.        Left eye: No discharge.     Conjunctiva/sclera: Conjunctivae normal.     Pupils: Pupils are equal, round, and reactive to light.  Neck:     Musculoskeletal: Normal range of motion and neck supple.     Thyroid: No thyromegaly.  Cardiovascular:     Rate and Rhythm: Normal rate and regular rhythm.     Heart sounds: Normal heart sounds. No murmur. No friction rub. No gallop.   Pulmonary:     Effort: Pulmonary effort is normal. No respiratory distress.     Breath sounds: Normal breath sounds. No stridor. No wheezing or rales.  Abdominal:     General: Bowel sounds are normal.  There is no distension.     Palpations: Abdomen is soft.     Tenderness: There is no abdominal tenderness. There is no guarding or rebound.  Musculoskeletal:     Comments: 2 cm crescent-shaped laceration over the left index finger PIP; full range of motion with flexion, extension of the digit, sensation intact, cap refill less than 2 seconds  Lymphadenopathy:     Cervical: No cervical adenopathy.  Skin:    General: Skin is warm and dry.     Coloration: Skin is not pale.     Findings: No rash.  Neurological:     Mental Status: He is alert.     Coordination: Coordination normal.      ED Treatments / Results  Labs (all labs ordered are listed, but only abnormal results are displayed) Labs Reviewed - No data to display  EKG None  Radiology No results found.  Procedures .Marland KitchenLaceration Repair  Date/Time: 05/03/2019 12:09 PM Performed by: Emi Holes, PA-C Authorized by: Emi Holes, PA-C   Consent:    Consent obtained:  Verbal   Consent given by:  Patient   Risks discussed:  Infection, pain and poor wound healing Anesthesia (see MAR for exact dosages):    Anesthesia method:  Nerve block   Block location:  Digital block   Block needle gauge:  25 G   Block anesthetic:  Lidocaine 1% w/o epi   Block technique:  Ring block   Block injection procedure:  Anatomic landmarks identified, introduced needle, incremental injection, anatomic landmarks palpated and negative aspiration for blood   Block outcome:  Anesthesia achieved Laceration details:    Location:  Finger   Finger location:  L index finger   Length (cm):  2 Repair type:    Repair type:  Simple Pre-procedure details:    Preparation:  Patient was prepped and draped in usual sterile fashion Exploration:    Hemostasis achieved with:  Direct pressure and tourniquet   Wound exploration: wound explored through full range of motion and entire depth of wound probed and visualized     Wound extent: no foreign  bodies/material noted, no muscle damage noted and no tendon damage noted     Contaminated: no   Treatment:    Area cleansed with:  Saline   Amount of cleaning:  Standard   Irrigation solution:  Sterile saline   Irrigation volume:  100cc   Irrigation method:  Syringe  Visualized foreign bodies/material removed: no   Skin repair:    Repair method:  Sutures   Suture size:  5-0   Wound skin closure material used: Ethilon.   Suture technique:  Simple interrupted   Number of sutures:  5 Approximation:    Approximation:  Close Post-procedure details:    Dressing:  Antibiotic ointment, bulky dressing and splint for protection   Patient tolerance of procedure:  Tolerated well, no immediate complications   (including critical care time)  Medications Ordered in ED Medications  lidocaine (PF) (XYLOCAINE) 1 % injection 20 mL (has no administration in time range)     Initial Impression / Assessment and Plan / ED Course  I have reviewed the triage vital signs and the nursing notes.  Pertinent labs & imaging results that were available during my care of the patient were reviewed by me and considered in my medical decision making (see chart for details).        Left index finger laceration after cutting [place].  No tendon injury in a bloodless field with full range of motion.  Tetanus UTD. Laceration occurred < 12 hours prior to repair. Discussed laceration care with pt and answered questions. Pt to f-u for suture removal in 10 days and wound check sooner should there be signs of dehiscence or infection. Pt is hemodynamically stable with no complaints prior to dc.     Final Clinical Impressions(s) / ED Diagnoses   Final diagnoses:  Laceration of left index finger without foreign body without damage to nail, initial encounter    ED Discharge Orders    None           Emi HolesLaw, Jru Pense M, PA-C 05/03/19 1319    Terrilee FilesButler, Michael C, MD 05/03/19 1730

## 2019-05-03 NOTE — ED Triage Notes (Signed)
Laceration to L index finger from a kitchen knife.

## 2022-10-18 ENCOUNTER — Other Ambulatory Visit: Payer: Self-pay | Admitting: Pain Medicine

## 2022-10-18 MED ORDER — SILVER SULFADIAZINE 1 % EX CREA: TOPICAL_CREAM | CUTANEOUS | 1 refills | Status: AC
